# Patient Record
Sex: Male | Born: 1935 | Race: White | Hispanic: No | Marital: Married
Health system: Southern US, Community
[De-identification: ages and names within clinical notes are randomized; demographics above are authoritative.]

---

## 1998-02-12 ENCOUNTER — Ambulatory Visit (HOSPITAL_COMMUNITY): Admission: RE | Admit: 1998-02-12 | Discharge: 1998-02-12 | Payer: Self-pay | Admitting: Cardiology

## 1999-01-03 ENCOUNTER — Ambulatory Visit (HOSPITAL_COMMUNITY): Admission: RE | Admit: 1999-01-03 | Discharge: 1999-01-03 | Payer: Self-pay | Admitting: Cardiology

## 1999-01-26 ENCOUNTER — Inpatient Hospital Stay (HOSPITAL_COMMUNITY): Admission: AD | Admit: 1999-01-26 | Discharge: 1999-01-27 | Payer: Self-pay | Admitting: Cardiology

## 1999-03-01 ENCOUNTER — Inpatient Hospital Stay (HOSPITAL_COMMUNITY): Admission: AD | Admit: 1999-03-01 | Discharge: 1999-03-04 | Payer: Self-pay | Admitting: Cardiology

## 1999-03-24 ENCOUNTER — Inpatient Hospital Stay (HOSPITAL_COMMUNITY): Admission: AD | Admit: 1999-03-24 | Discharge: 1999-03-25 | Payer: Self-pay | Admitting: Cardiology

## 1999-03-30 ENCOUNTER — Encounter: Payer: Self-pay | Admitting: *Deleted

## 1999-03-30 ENCOUNTER — Inpatient Hospital Stay (HOSPITAL_COMMUNITY): Admission: AD | Admit: 1999-03-30 | Discharge: 1999-04-01 | Payer: Self-pay | Admitting: *Deleted

## 2000-03-21 ENCOUNTER — Inpatient Hospital Stay (HOSPITAL_COMMUNITY): Admission: EM | Admit: 2000-03-21 | Discharge: 2000-03-23 | Payer: Self-pay | Admitting: Emergency Medicine

## 2000-03-21 ENCOUNTER — Encounter: Payer: Self-pay | Admitting: Family Medicine

## 2000-03-21 ENCOUNTER — Encounter: Admission: RE | Admit: 2000-03-21 | Discharge: 2000-03-21 | Payer: Self-pay | Admitting: Family Medicine

## 2001-03-01 ENCOUNTER — Encounter: Admission: RE | Admit: 2001-03-01 | Discharge: 2001-03-01 | Payer: Self-pay | Admitting: Family Medicine

## 2001-03-01 ENCOUNTER — Encounter: Payer: Self-pay | Admitting: Family Medicine

## 2004-01-13 ENCOUNTER — Encounter: Admission: RE | Admit: 2004-01-13 | Discharge: 2004-01-13 | Payer: Self-pay | Admitting: Family Medicine

## 2004-05-12 ENCOUNTER — Ambulatory Visit (HOSPITAL_COMMUNITY): Admission: RE | Admit: 2004-05-12 | Discharge: 2004-05-13 | Payer: Self-pay | Admitting: Ophthalmology

## 2004-07-07 ENCOUNTER — Encounter: Admission: RE | Admit: 2004-07-07 | Discharge: 2004-07-07 | Payer: Self-pay | Admitting: Family Medicine

## 2004-07-14 ENCOUNTER — Encounter (HOSPITAL_COMMUNITY): Admission: RE | Admit: 2004-07-14 | Discharge: 2004-07-22 | Payer: Self-pay | Admitting: Family Medicine

## 2004-08-02 ENCOUNTER — Inpatient Hospital Stay (HOSPITAL_COMMUNITY): Admission: EM | Admit: 2004-08-02 | Discharge: 2004-08-06 | Payer: Self-pay | Admitting: Emergency Medicine

## 2004-12-26 ENCOUNTER — Encounter: Admission: RE | Admit: 2004-12-26 | Discharge: 2004-12-26 | Payer: Self-pay | Admitting: Family Medicine

## 2005-03-20 ENCOUNTER — Inpatient Hospital Stay (HOSPITAL_COMMUNITY): Admission: EM | Admit: 2005-03-20 | Discharge: 2005-03-25 | Payer: Self-pay | Admitting: Emergency Medicine

## 2005-04-19 ENCOUNTER — Encounter: Admission: RE | Admit: 2005-04-19 | Discharge: 2005-04-19 | Payer: Self-pay | Admitting: Family Medicine

## 2006-08-28 IMAGING — CR DG CHEST 1V PORT
1 series · 1 of 1 positions shown · non-contrast
Comparison: 12/26/04.

CLINICAL DATA: Short of breath. 
 PORTABLE CHEST ? 1 VIEW ([DATE] HOURS):

[view not recorded]
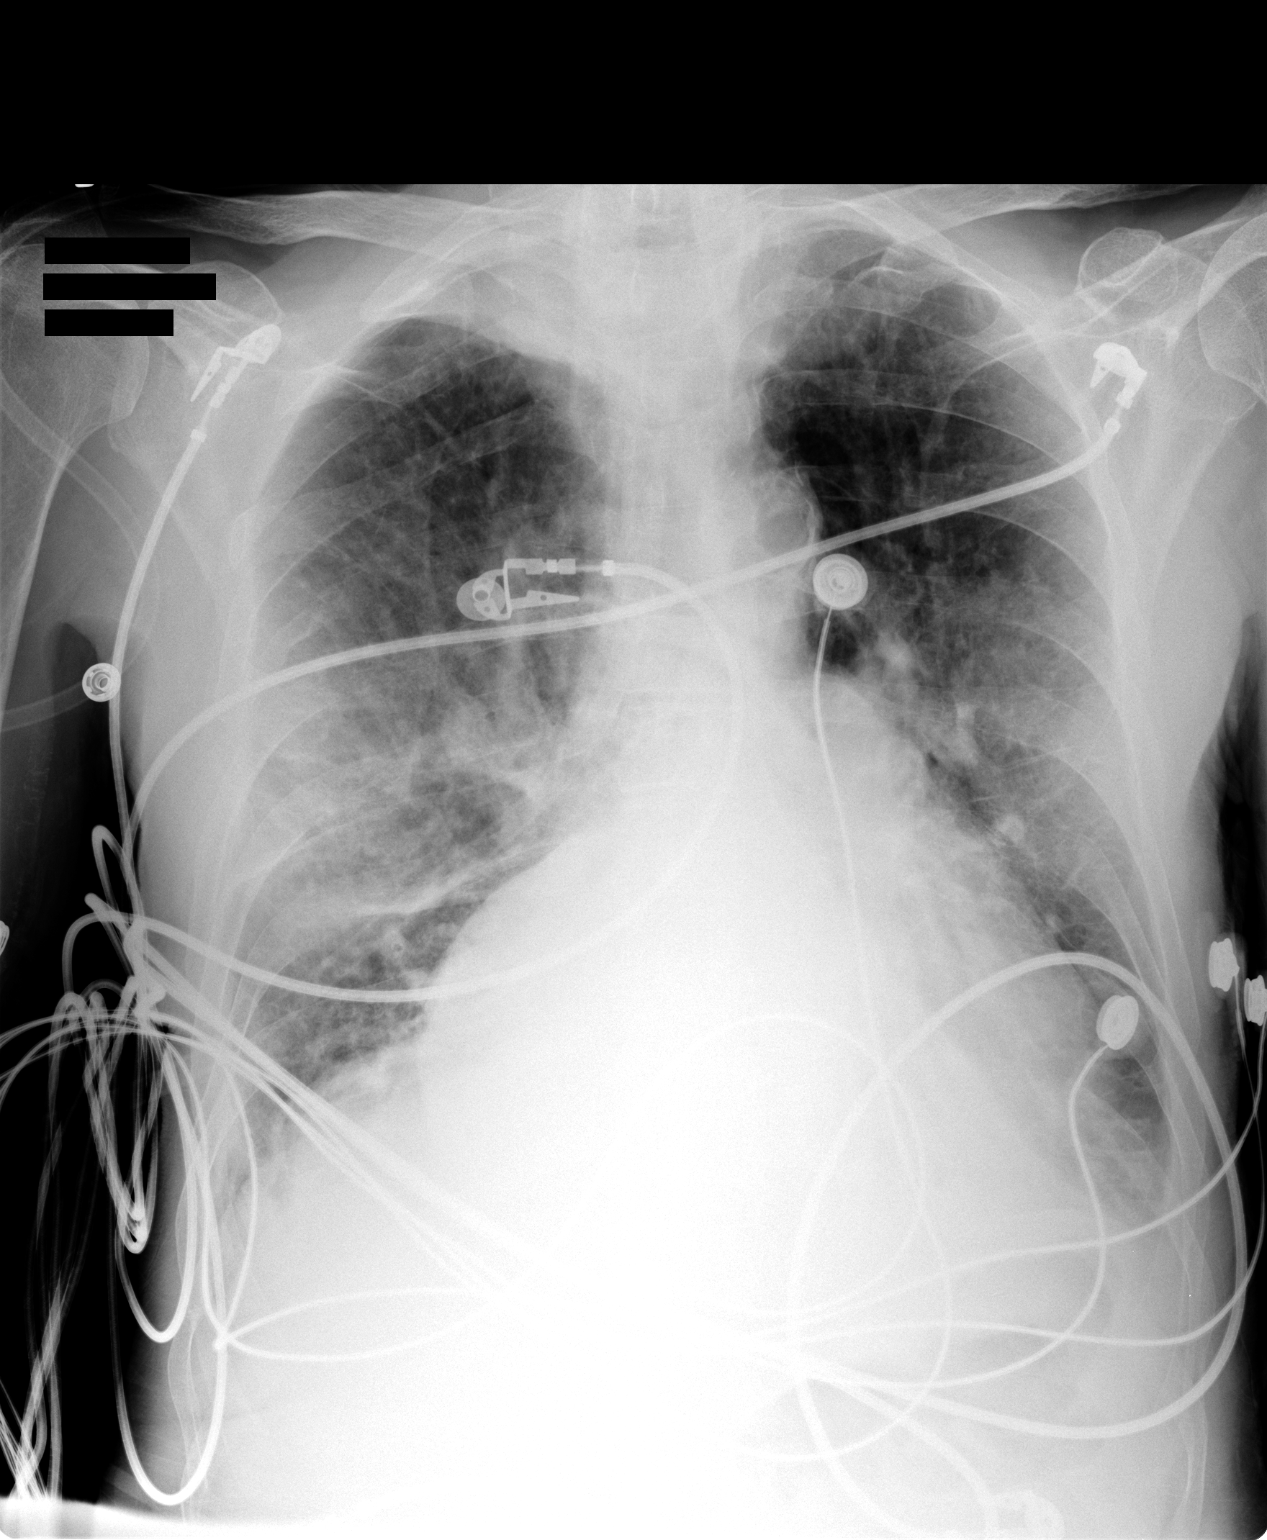

[1 of 1 positions shown; findings below may reference images not displayed]

FINDINGS: Artifact overlies the chest.  There is chronic pulmonary fibrosis but likely superimposed pneumonia in the right midlung and at the left base.  There may be pulmonary venous hypertension as well.
IMPRESSION: Chronic lung disease with suspicion of superimposed bilateral infiltrate and possible fluid overload.

## 2006-09-27 IMAGING — CR DG CHEST 2V
2 series · 2 of 2 positions shown · non-contrast
Comparison: 03/22/05.

CLINICAL DATA: Followup pneumonia.  Shortness of breath. 
 CHEST ? 2 VIEW:

[view not recorded (1 of 2)]
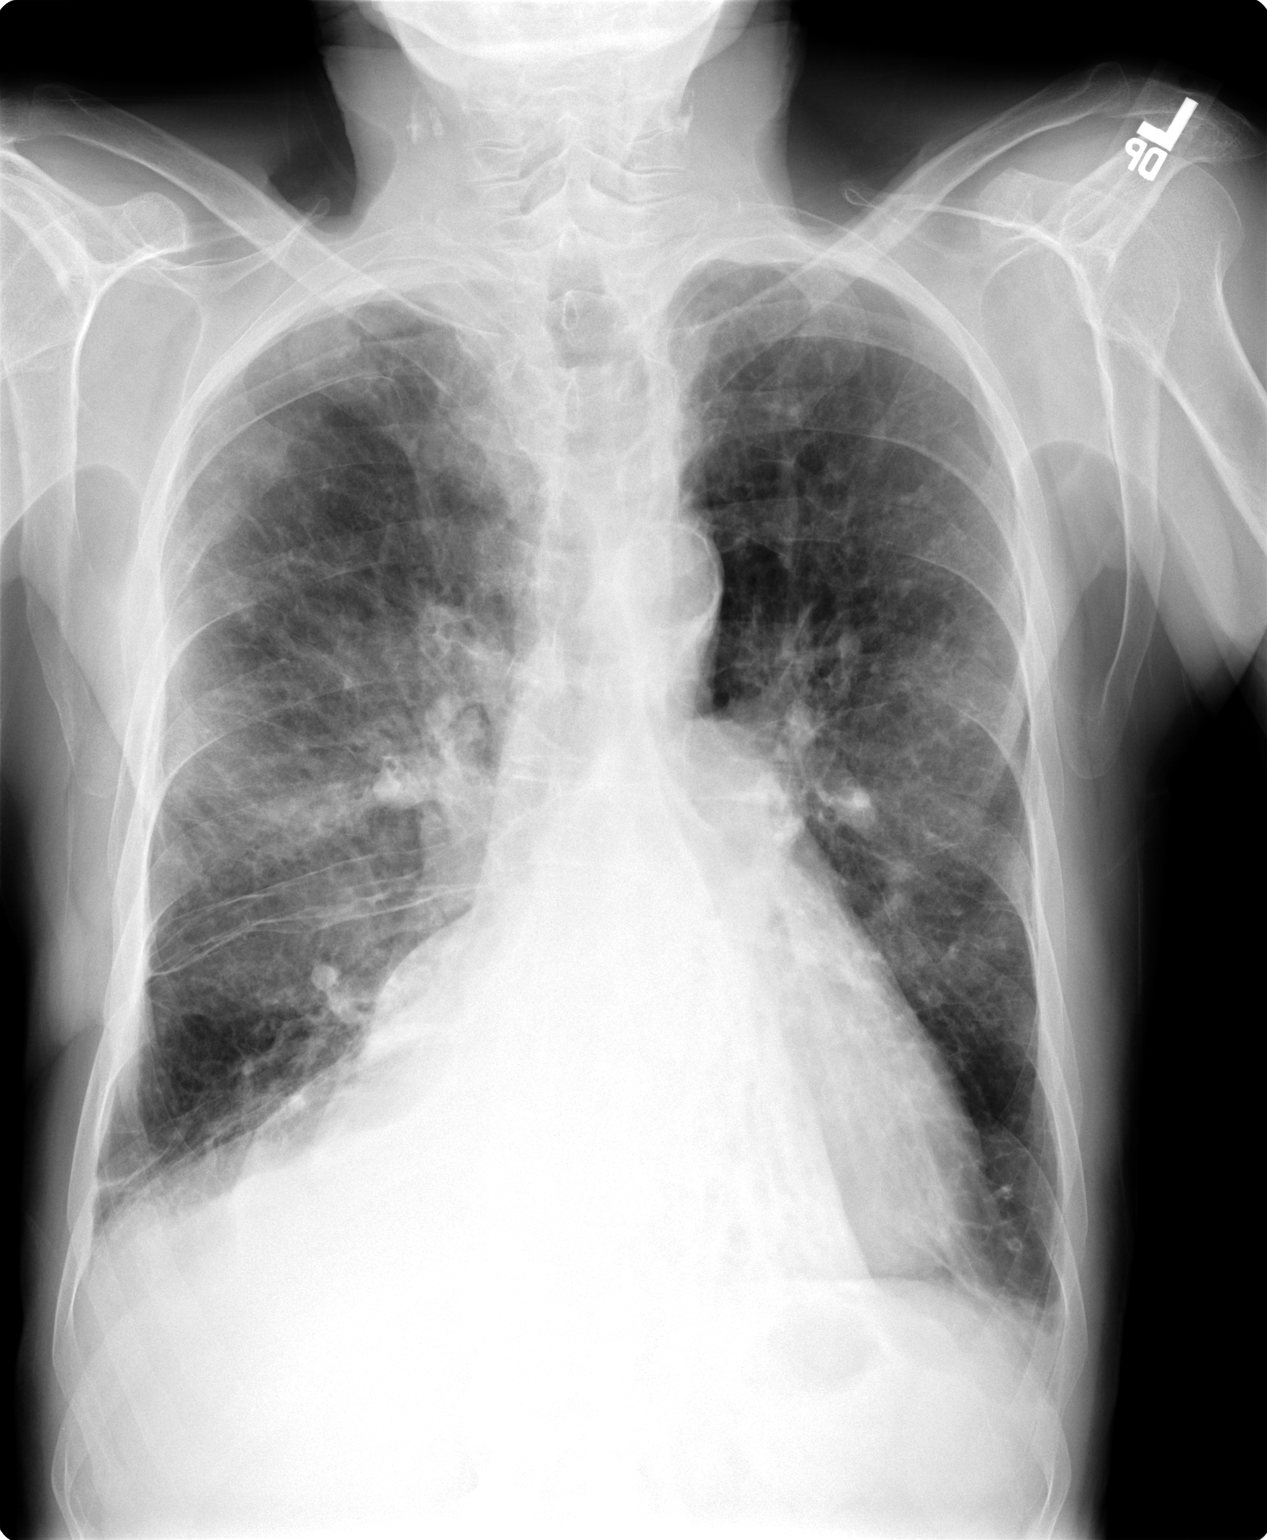

[view not recorded (2 of 2)]
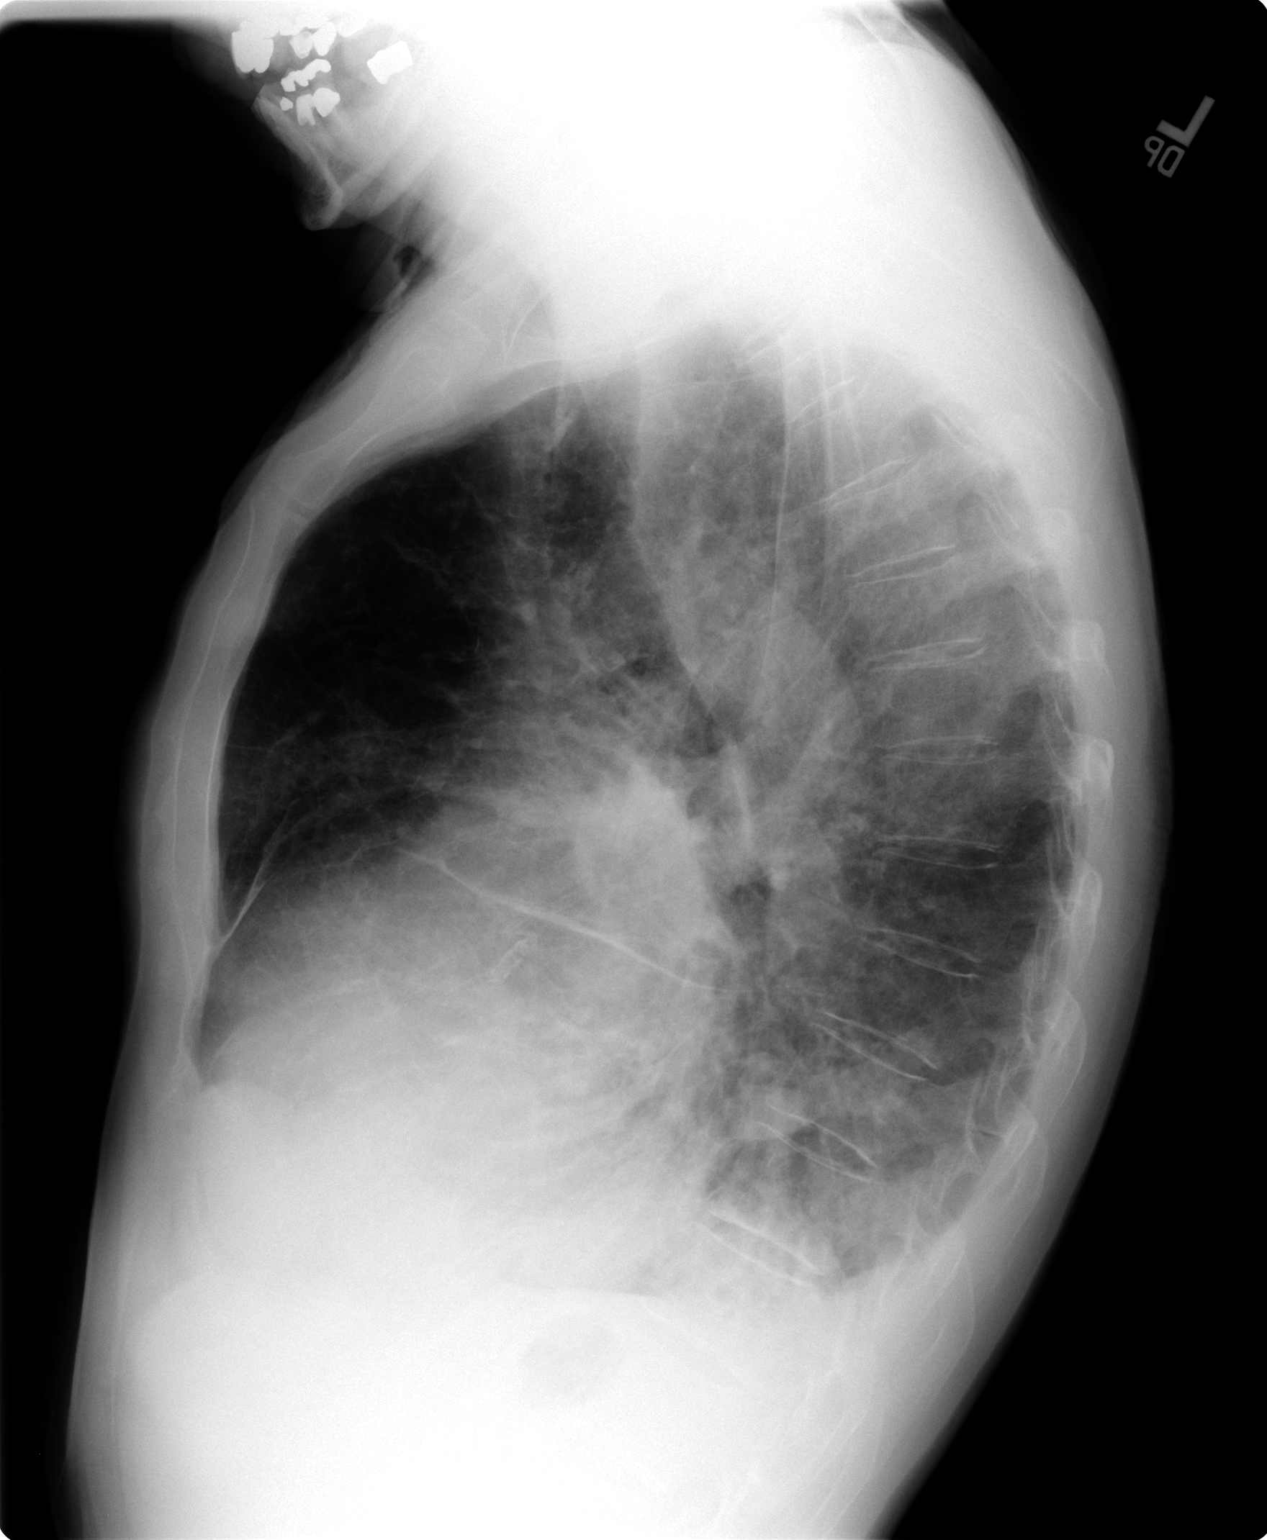

[2 of 2 positions shown; findings below may reference images not displayed]

FINDINGS: There is stable cardiomegaly.  There is chronic volume loss within the left lower lobe with probable chronic bronchiectasis.  This is unchanged in my judgment when compared with older films.  When compared with the prior study the bilateral pleural effusions have decreased in size.  There is mild residual interstitial accentuation present bilaterally in a diffuse fashion suggesting mild interstitial edema.  Remainder of the study is unchanged.
IMPRESSION: 1.  Stable cardiomegaly.  Probable mild diffuse interstitial pulmonary edema present.  The bilateral pleural effusions have decreased intervally.
 2.  Chronic left lower lobe atelectasis or probable bronchiectasis.

## 2008-09-13 DEATH — deceased

## 2018-07-04 ENCOUNTER — Telehealth: Payer: Self-pay | Admitting: *Deleted

## 2018-07-04 NOTE — Telephone Encounter (Signed)
Opened phone encounter in error
